# Patient Record
Sex: Male | Born: 2000 | Race: Black or African American | Hispanic: No | Marital: Single | Smoking: Never smoker
Health system: Southern US, Community
[De-identification: ages and names within clinical notes are randomized; demographics above are authoritative.]

## PROBLEM LIST (undated history)

## (undated) DIAGNOSIS — J45909 Unspecified asthma, uncomplicated: Secondary | ICD-10-CM

---
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
## 2021-07-16 IMAGING — CT CT HEAD W/O CM
3 series · 16 of 47 positions shown, 19 images · non-contrast
Comparison: None.

CLINICAL DATA: Head trauma, syncopal episode.

EXAM:
CT HEAD WITHOUT CONTRAST
TECHNIQUE: Contiguous axial images were obtained from the base of the skull
through the vertex without intravenous contrast.

[Series 2: head wo · axial · 0.42mm/px · z∈[+691,+821]mm · 10 of 32 slices shown, 13 images]
[im 3/32  brain]
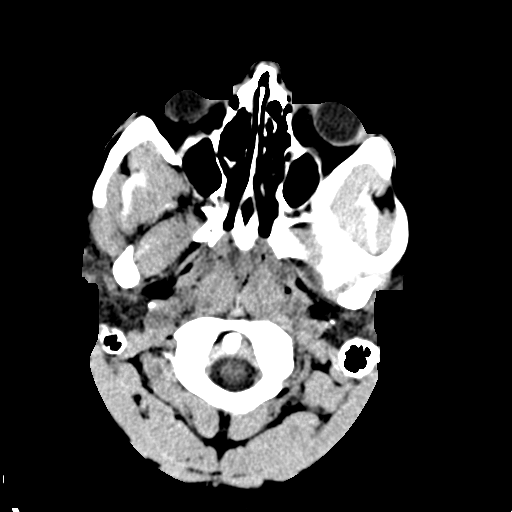
[im 3/32  bone]
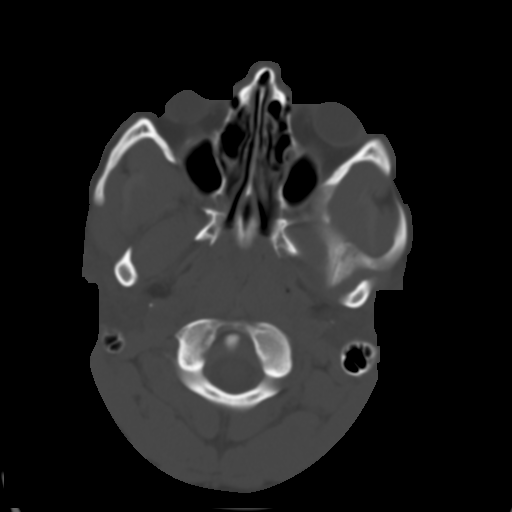
[im 6/32  brain]
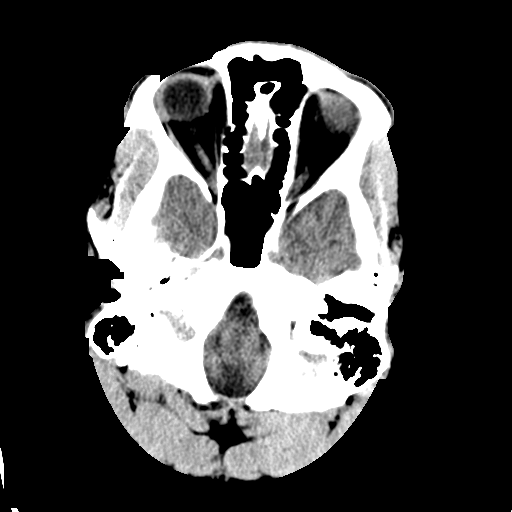
[im 9/32  brain]
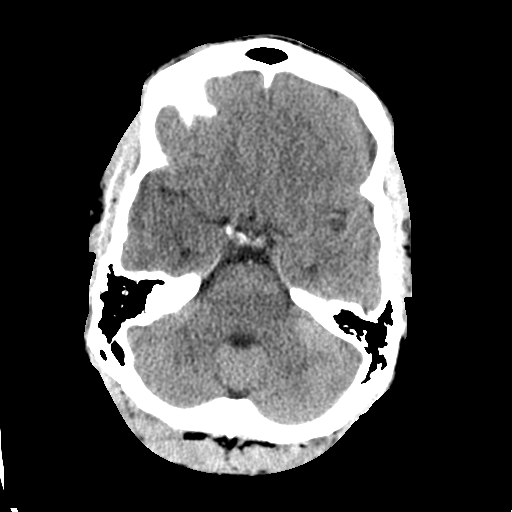
[im 11/32  brain]
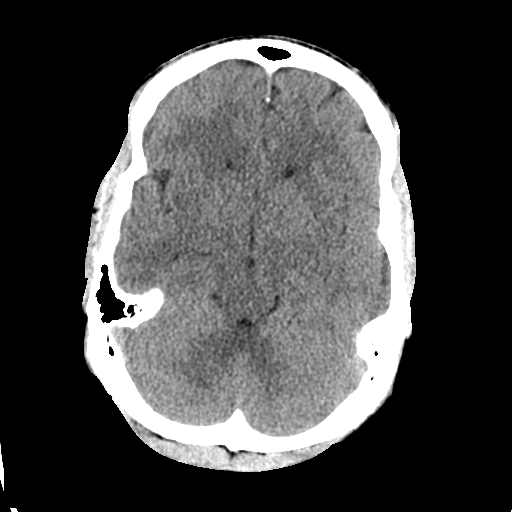
[im 14/32  brain]
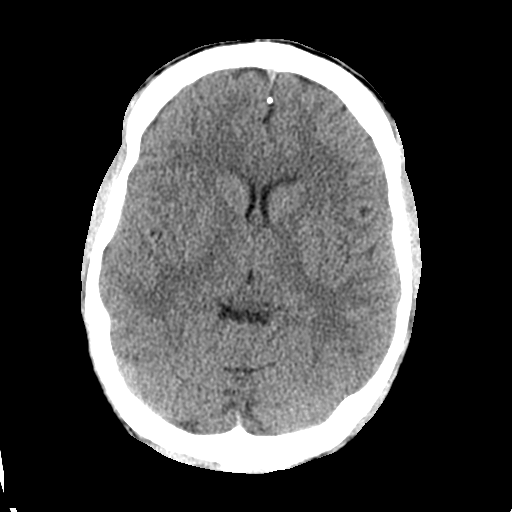
[im 14/32  bone]
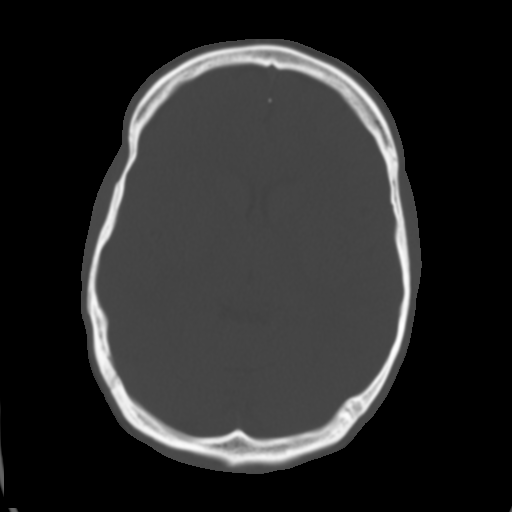
[im 18/32  brain]
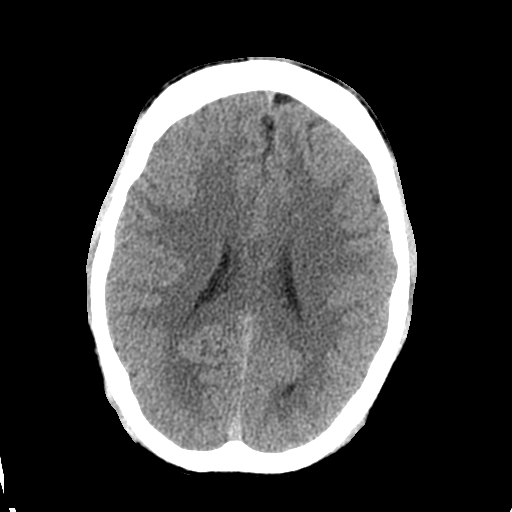
[im 21/32  brain]
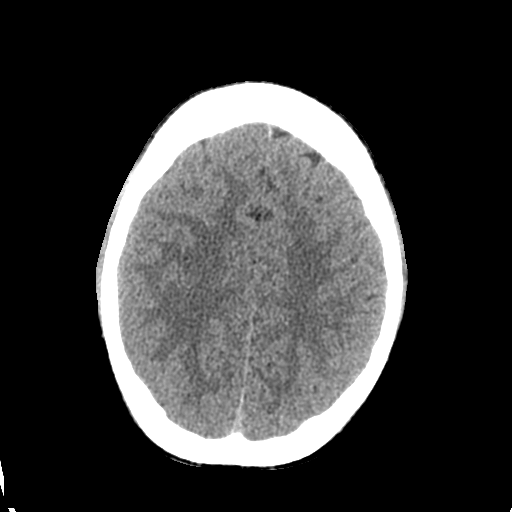
[im 24/32  brain]
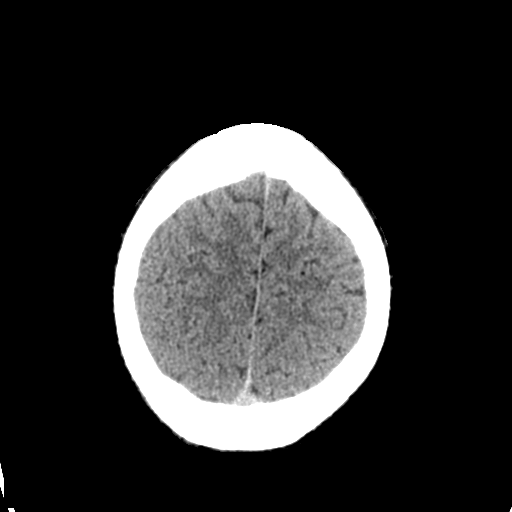
[im 26/32  brain]
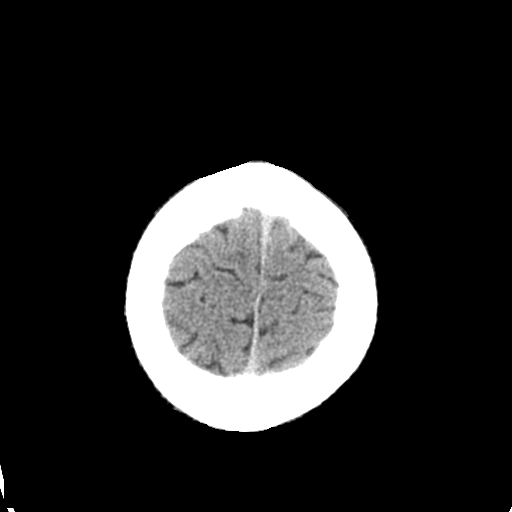
[im 26/32  bone]
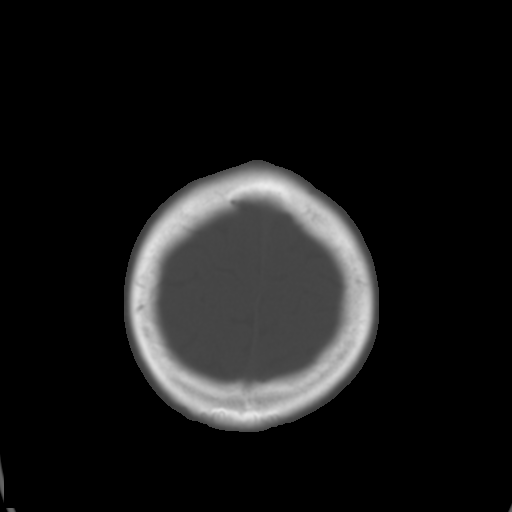
[im 29/32  brain]
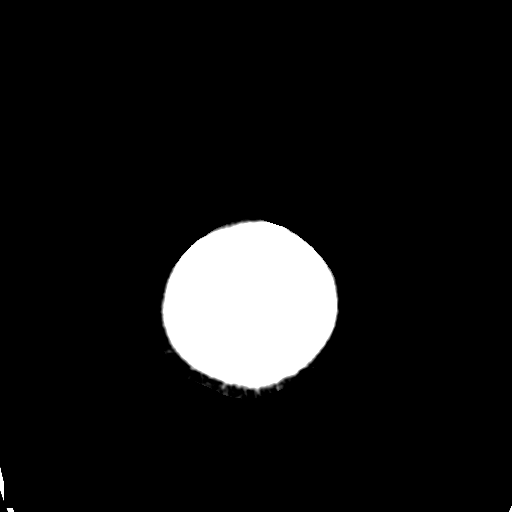

[Series 4: coronal soft tissue · coronal · 0.32mm/px · 3 of 73 slices shown]
[im 25/73  brain]
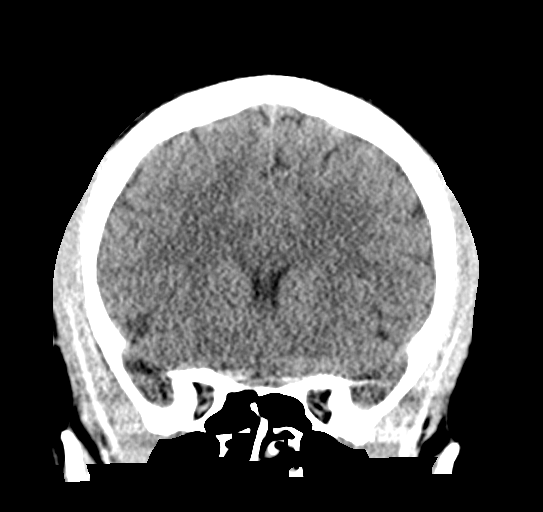
[im 33/73  brain]
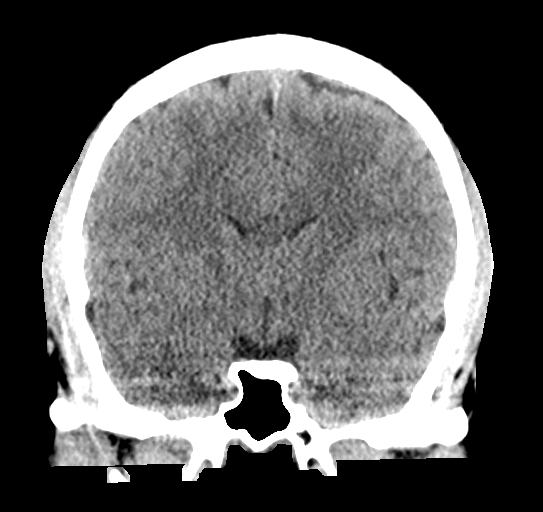
[im 41/73  brain]
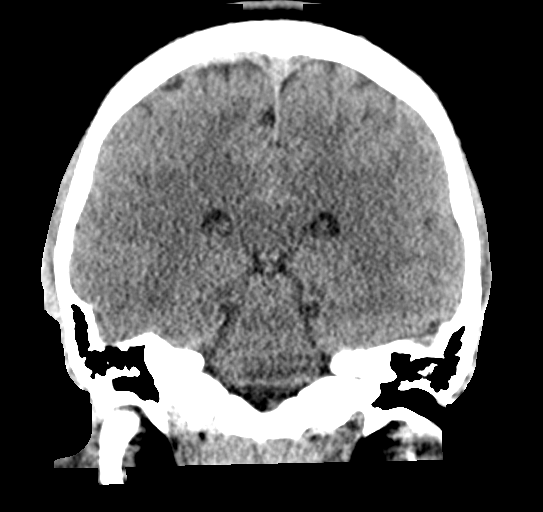

[Series 5: sagittal soft tissue · sagittal · 0.33mm/px · 3 of 60 slices shown]
[im 20/60  brain]
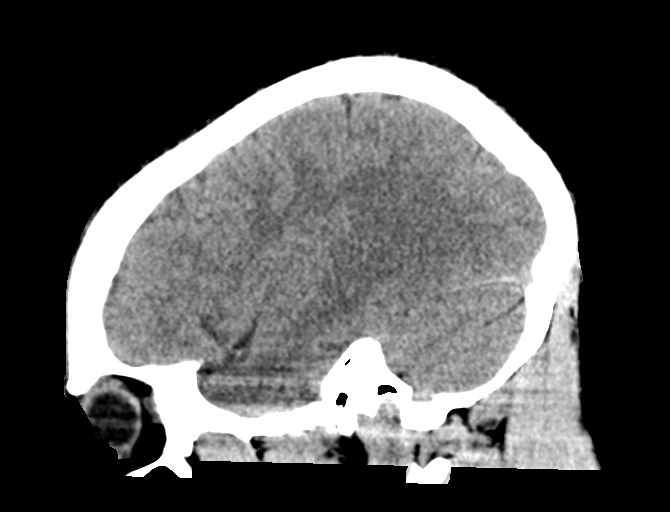
[im 30/60  brain]
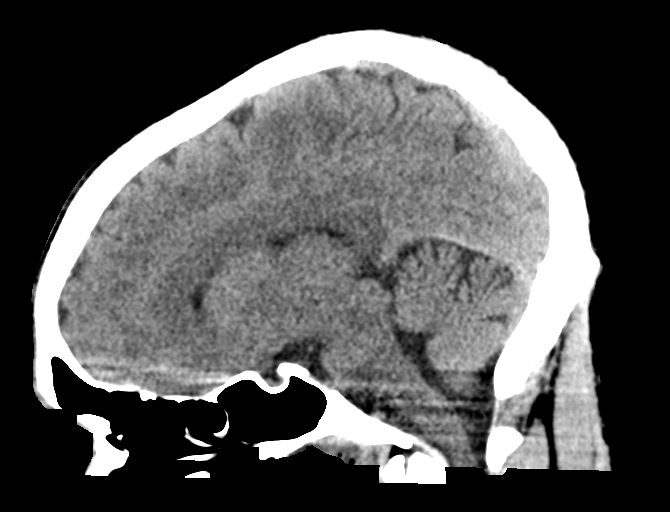
[im 40/60  brain]
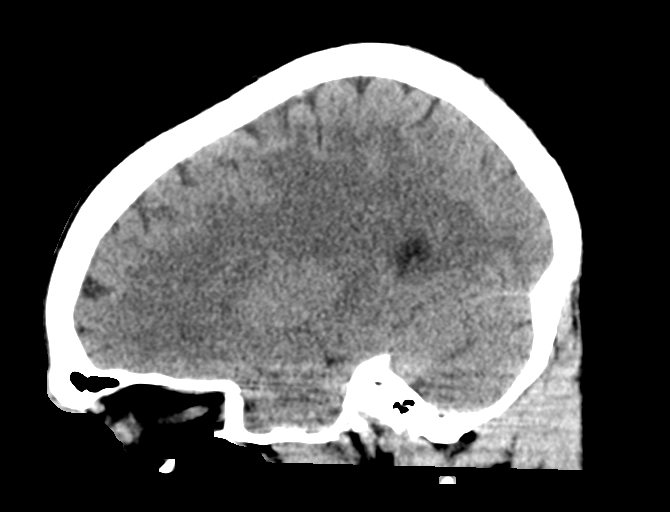

[16 of 47 positions shown; findings below may reference images not displayed]

FINDINGS: Brain: Ventricles are normal in size and configuration. All areas of
the brain demonstrate appropriate gray-white matter differentiation.
No hemorrhage, edema or other evidence of acute parenchymal
abnormality. No extra-axial hemorrhage.

Vascular: No hyperdense vessel or unexpected calcification.

Skull: Normal. Negative for fracture or focal lesion.

Sinuses/Orbits: No acute finding.

Other: None.
IMPRESSION: Negative head CT. No intracranial hemorrhage or edema. No skull
fracture.

## 2021-07-16 IMAGING — CR DG CHEST 2V
2 series · 2 of 2 positions shown · non-contrast
Comparison: None.

CLINICAL DATA: Syncope

EXAM:
CHEST - 2 VIEW

[chest lat]
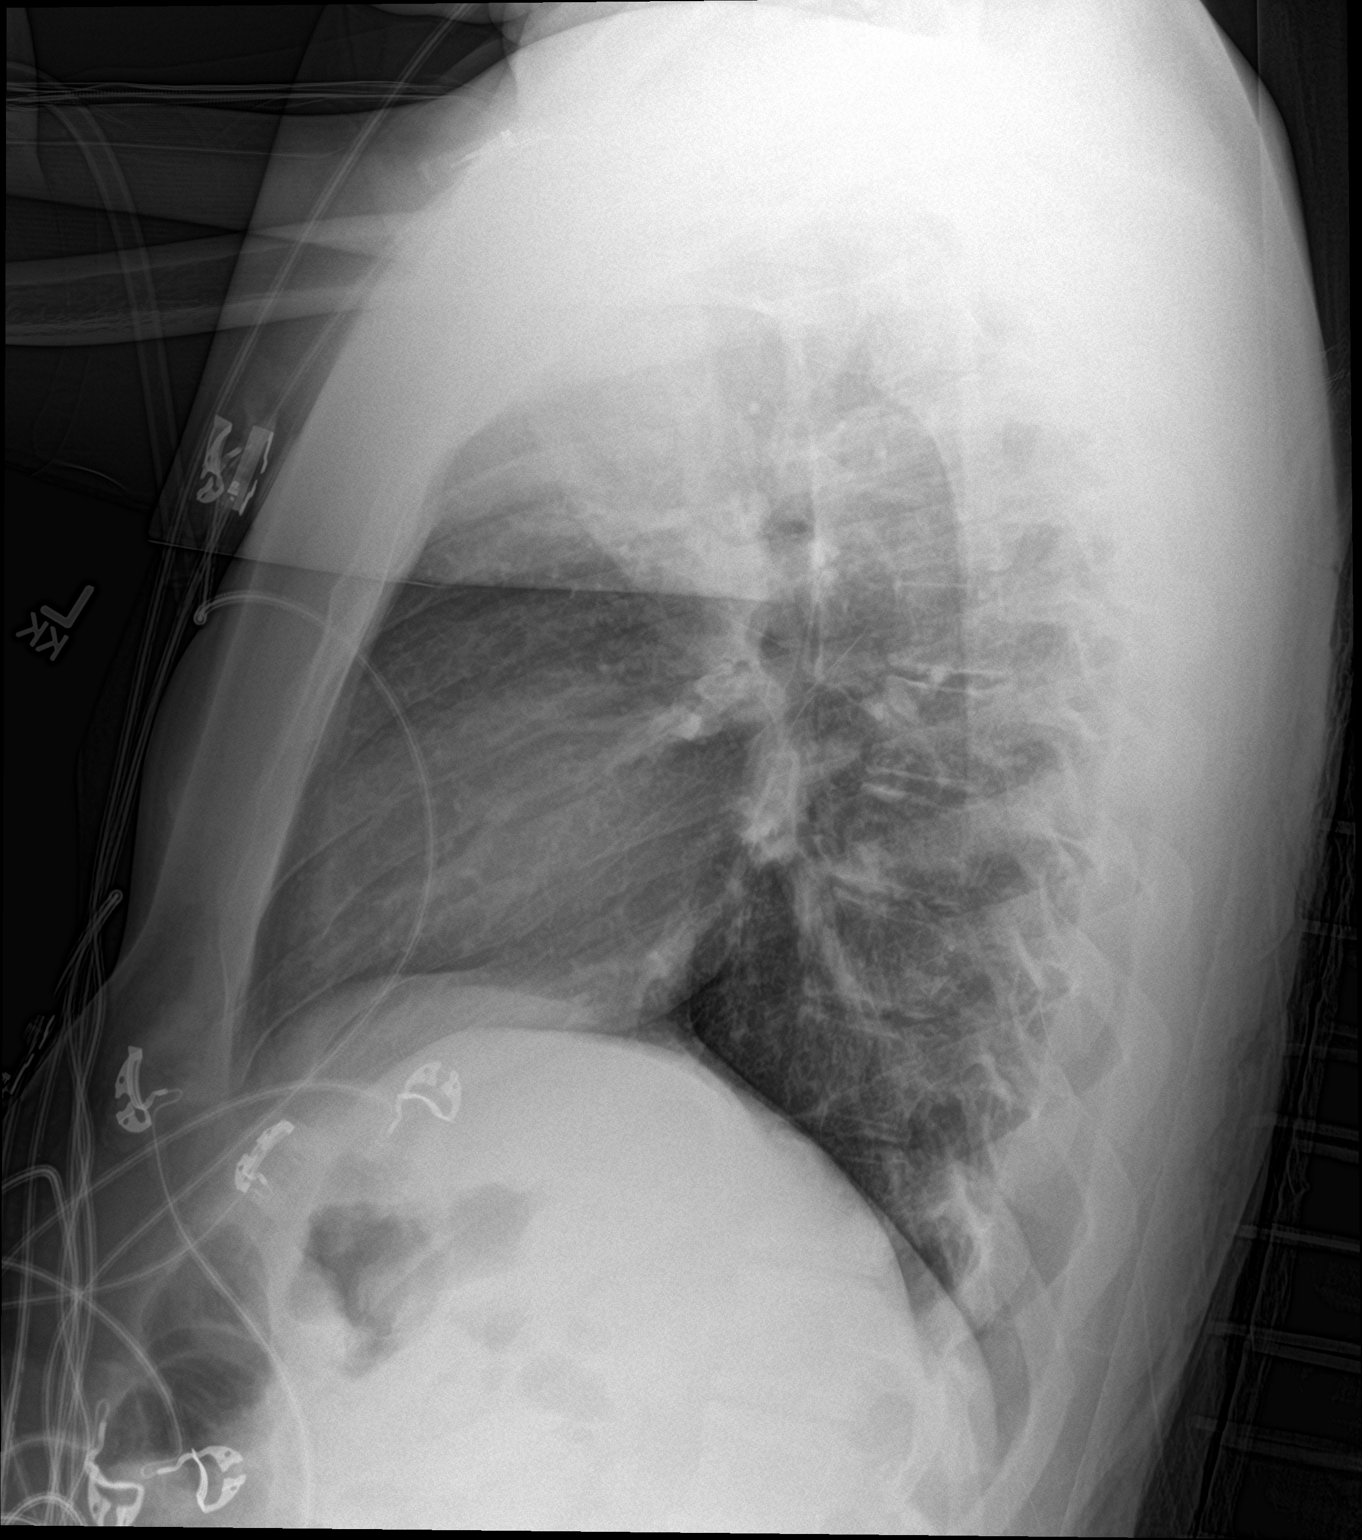

[chest ap]
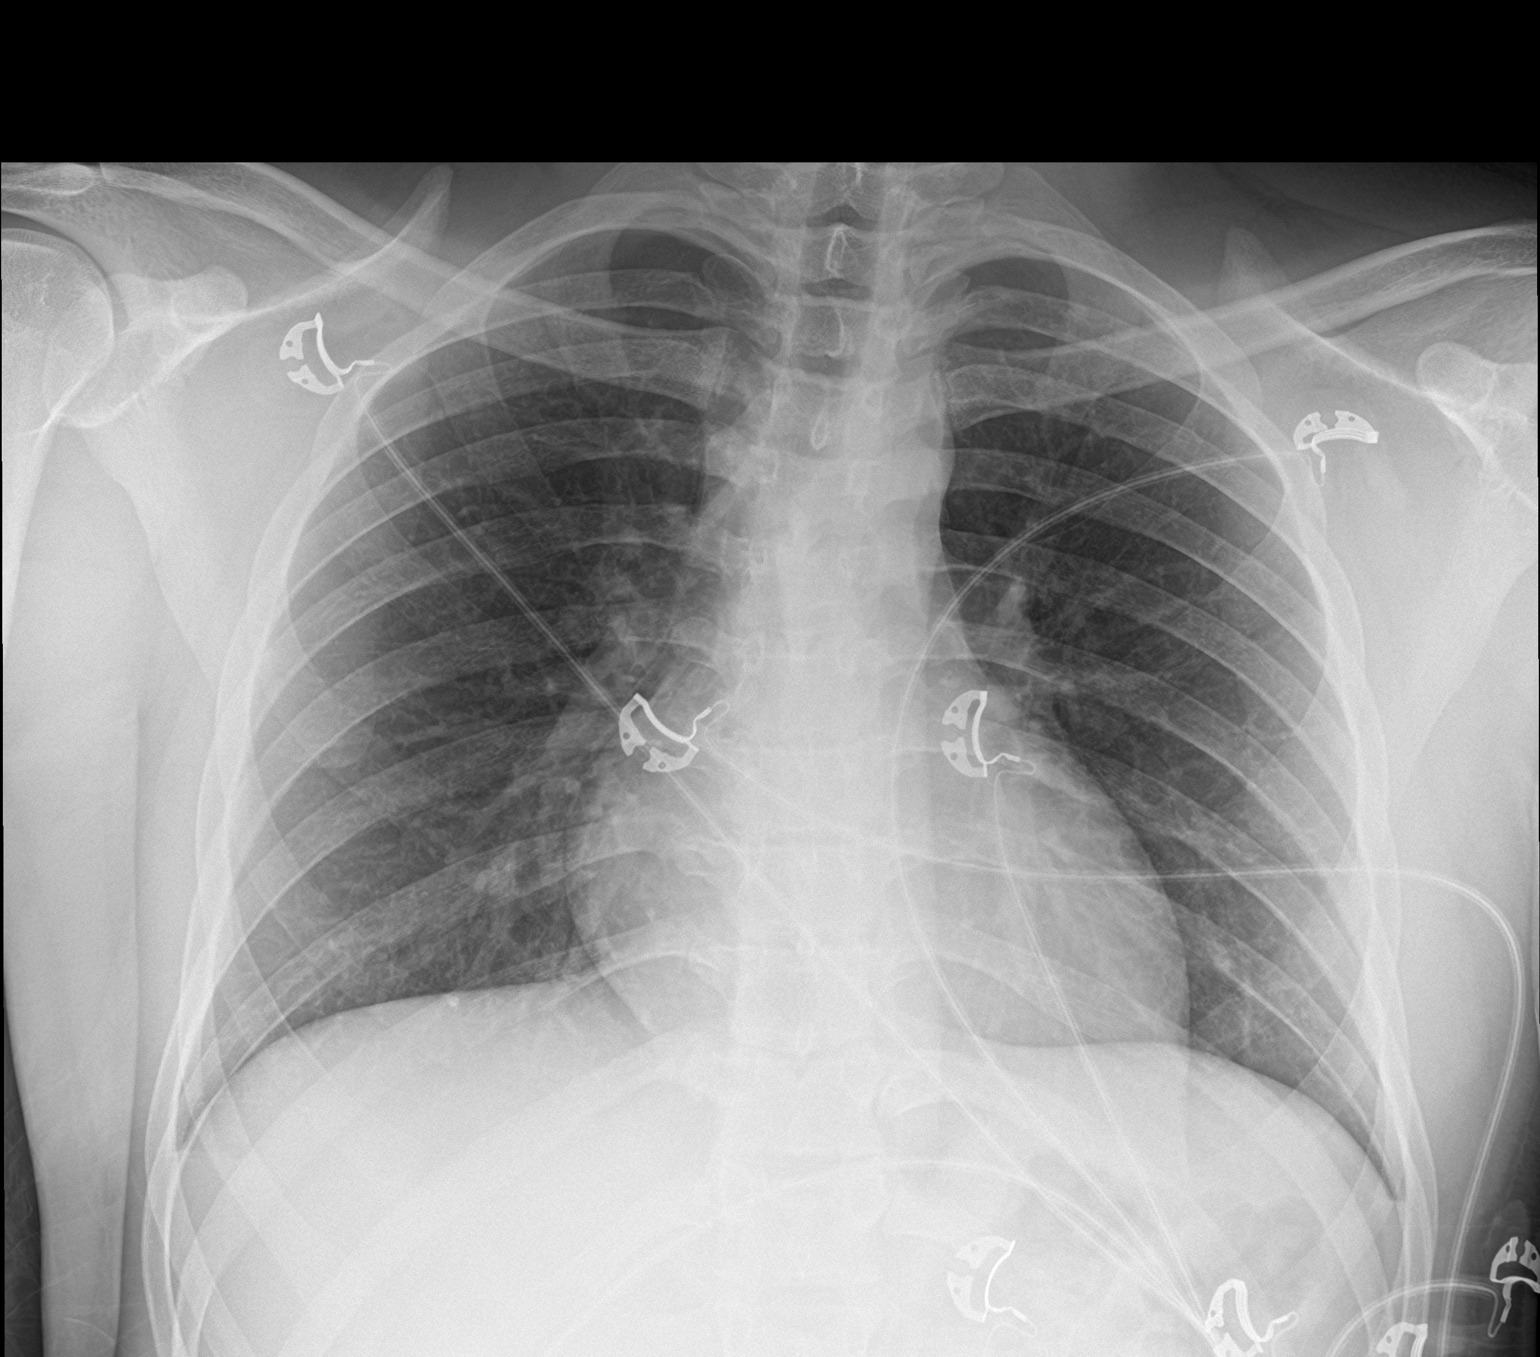

[2 of 2 positions shown; findings below may reference images not displayed]

FINDINGS: The heart size and mediastinal contours are within normal limits.
Both lungs are clear. No pleural effusion. The visualized skeletal
structures are unremarkable.
IMPRESSION: No acute process in the chest.
